# Patient Record
Sex: Female | Born: 1970 | Race: White | Hispanic: No | Marital: Married | Smoking: Never smoker
Health system: Southern US, Community
[De-identification: ages and names within clinical notes are randomized; demographics above are authoritative.]

---

## 1997-11-09 HISTORY — PX: REFRACTIVE SURGERY: SHX103

## 2005-05-05 ENCOUNTER — Ambulatory Visit: Payer: Self-pay | Admitting: Obstetrics and Gynecology

## 2009-05-03 ENCOUNTER — Ambulatory Visit: Payer: Self-pay | Admitting: Obstetrics and Gynecology

## 2010-06-04 ENCOUNTER — Ambulatory Visit: Payer: Self-pay | Admitting: Obstetrics and Gynecology

## 2011-05-27 ENCOUNTER — Encounter: Payer: Self-pay | Admitting: Sports Medicine

## 2011-05-27 ENCOUNTER — Ambulatory Visit (INDEPENDENT_AMBULATORY_CARE_PROVIDER_SITE_OTHER): Payer: BC Managed Care – PPO | Admitting: Sports Medicine

## 2011-05-27 VITALS — BP 132/92 | HR 80 | Ht 66.0 in | Wt 135.0 lb

## 2011-05-27 DIAGNOSIS — M25569 Pain in unspecified knee: Secondary | ICD-10-CM

## 2011-05-27 DIAGNOSIS — M25561 Pain in right knee: Secondary | ICD-10-CM | POA: Insufficient documentation

## 2011-05-27 NOTE — Progress Notes (Signed)
  Subjective:    Patient ID: Tiffany Mayer, female    DOB: 11-05-1971, 40 y.o.   MRN: [id number]  HPI  New pt that presents to clinic for evaluation of rt anteriolateral knee pain.   Review of Systems     Objective:   Physical Exam        Assessment & Plan:

## 2011-05-27 NOTE — Assessment & Plan Note (Addendum)
I suspect that she has a combination of iliotibial band syndrome and hip abductor weakness. Her new shoes are also likely contributory and these need to be broken in. We recommended a set of rehabilitation exercises to strengthen her hip abductors, including hip rotation, step ups, lying hip abductor raises. We will also mail her a knee sleeve to reduce swelling as we did not have the correct size in stock today. I would like to see her back in 6 weeks.  Also given sports insoles for cushion and her gait improves with this

## 2011-05-27 NOTE — Progress Notes (Signed)
  Subjective:    Patient ID: Tiffany Mayer, female    DOB: 07/07/1971, 40 y.o.   MRN: [id number] PCP: Lorie Phenix HPI Tiffany Mayer comes to see [place] for right lateral knee pain for approximately 1.5 months. She runs approximately 25-30 miles per week, teaches aerobics, does Pilates, and does weight training, elliptical, and bike. She notes that approximately 1.5 months ago when she bought new running shoes, she began to develop pain in the anterolateral knee. There was no known injury. Pain is worse with running, increased walking, and going down stairs. Since then she has begin running on flat or roads, and runs approximately 1 day less than usual. After these changes she noted that her pain is starting to decrease. She notices no popping catching or locking or giving way. There is also minimal to no swelling of the knee. No fevers chills weight loss or night sweats.  Past medical history: None Social history: Denies alcohol tobacco or drugs, works as a Runner, broadcasting/film/video in [employer]. Family history: Heart disease and high blood pressure and her father. Review of Systems    negative except as noted above in the history of present illness. Objective:   Physical Exam    general: Well-developed well-nourished Caucasian female. Musculoskeletal exam: The patient's knees are overall unremarkable to inspection, she does have bilateral prominent lateral tibial plateaus. Her range of motion is full from 0-135. Strength is 5 out of 5 to flexion and extension. Anterior cruciate ligament, PCL, MCL, LCL are all intact. McMurray's test is negative. Hip abductor strength is very weak bilaterally. She has no tenderness to palpation over the pes anserine insertion.  We analyzed her gait in both her old and new running shoes: I did note a near perfect running form in her old shoes, and some right knee wobble when running in her new shoes.  Musculoskeletal ultrasound:  We imaged her patellar tendon and  long and transverse views, menisci, and iliotibial band. We did note a small amount of fluid in the retro-patellar bursa, she also had a small amount of fluid just deep to her iliotibial band. Menisci are unremarkable and patellar tendon was unremarkable. Images saved.  Assessment & Plan:

## 2011-06-09 ENCOUNTER — Ambulatory Visit: Payer: Self-pay | Admitting: Obstetrics and Gynecology

## 2011-07-15 ENCOUNTER — Ambulatory Visit (INDEPENDENT_AMBULATORY_CARE_PROVIDER_SITE_OTHER): Payer: BC Managed Care – PPO | Admitting: Sports Medicine

## 2011-07-15 VITALS — BP 130/97

## 2011-07-15 DIAGNOSIS — M25561 Pain in right knee: Secondary | ICD-10-CM

## 2011-07-15 DIAGNOSIS — M25569 Pain in unspecified knee: Secondary | ICD-10-CM

## 2011-07-15 NOTE — Assessment & Plan Note (Signed)
Improved, likely from iliotibial band Continue use of compression sleeve x6 weeks Continue hip strengthening exercises If continuing to improve can return as needed, if not improved return after 6 weeks

## 2011-07-15 NOTE — Progress Notes (Signed)
  Subjective:    Patient ID: Tiffany Mayer, female    DOB: [birth year], 40 y.o.   MRN: [id number]  HPI Here for follow up of R knee pain.  Pain improved by 75% since last visit.  Still using compression sleeve on the knee, feels like that has helped.  Doing hip strengthening exercises daily.  Pain still occasionally when she first starts running or just stops running.  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    Review of Systems     Objective:   Physical Exam R Knee Normal to inspection and palpation Patellar compression negative MCL, LCL, ACL, PCL with good endpoints McMurray negative Strength 5/5 with flexion/extension, with FROM Hip abduction with 5/5 strength, no pain. Hip adduction with 5/5 strength, no pain.  Gait: R shoulder slightly lower than L Otherwise gait normal No leg length discrepancy.         Assessment & Plan:

## 2012-07-07 ENCOUNTER — Ambulatory Visit: Payer: Self-pay | Admitting: Obstetrics and Gynecology

## 2013-03-23 ENCOUNTER — Ambulatory Visit (INDEPENDENT_AMBULATORY_CARE_PROVIDER_SITE_OTHER): Payer: BC Managed Care – PPO | Admitting: Sports Medicine

## 2013-03-23 VITALS — BP 110/70 | Ht 66.0 in | Wt 135.0 lb

## 2013-03-23 DIAGNOSIS — M25579 Pain in unspecified ankle and joints of unspecified foot: Secondary | ICD-10-CM

## 2013-03-23 DIAGNOSIS — M25571 Pain in right ankle and joints of right foot: Secondary | ICD-10-CM

## 2013-03-24 NOTE — Progress Notes (Signed)
  Subjective:    Patient ID: Tiffany Mayer, female    DOB: 1971/08/05, 42 y.o.   MRN: [id number]  HPI chief complaint: Right foot pain  Very pleasant 42 year old female comes in today complaining of 2 months of dorsal right foot pain. She had 2 injuries leading up to her foot pain although she's not sure whether or not they contribute to her current symptoms. She stepped in a manhole suffering a dorsiflexion injury to her forefoot. This was followed by an inversion injury to her right ankle. However, the pain in her foot began sometime later. She describes an aching discomfort diffusely across the dorsum of her forefoot with running. In fact, she states the pain is most noticeable with walking and improves a little bit with running. She's noticed intermittent swelling. She denies pain on the plantar aspect of her foot. She denies problems with her foot in the past but she has been seen in our clinic previously for some knee issues and was given some green sports insoles which she recently started to use again. Right before her foot pain she was dealing with some left hamstring issues and wonders whether or not her pain is compensatory. In addition to running she enjoys kickboxing, aerobics, and Pilates.  Past medical history and current medications are reviewed    Review of Systems     Objective:   Physical Exam Well-developed, well-nourished. No acute distress. Vital signs are reviewed  Examination of the right foot in the standing position shows collapse of the transverse arch. She is tender to palpation along the distal second metatarsal. No soft tissue swelling. Mild pain with metatarsal squeeze but not marked. No tenderness to palpation elsewhere throughout the foot. Extensor tendons are intact. Examination of the right ankle shows full range of motion without an effusion. Good stability. Negative anterior drawer, negative talar tilt. Patient is able to walk without a limp  MSK ultrasound  of the right foot: Images were obtained both in long and short view. There appears to be some hyperechoic changes along the dorsum of the distal second metatarsal consistent with a probable healing stress fracture. I do not see any cortical defect but there is some edema in this area as well as neovascularization.       Assessment & Plan:  1. Right foot pain likely secondary to healing second metatarsal stress fracture  Metatarsal pads for her green sports insoles. Cross train for the next couple of weeks first on a bike and then transitioning to an elliptical. She can then start some light running as tolerated and will followup with me in 3 weeks. I've also asked her to refrain from impact activities such as aerobics and kickboxing.

## 2013-03-29 ENCOUNTER — Ambulatory Visit: Payer: BC Managed Care – PPO | Admitting: Sports Medicine

## 2013-04-13 ENCOUNTER — Ambulatory Visit
Admission: RE | Admit: 2013-04-13 | Discharge: 2013-04-13 | Disposition: A | Discharge status: Home / Self Care | Payer: BC Managed Care – PPO | Source: Ambulatory Visit | Attending: Sports Medicine | Admitting: Sports Medicine

## 2013-04-13 ENCOUNTER — Ambulatory Visit (INDEPENDENT_AMBULATORY_CARE_PROVIDER_SITE_OTHER): Payer: BC Managed Care – PPO | Admitting: Sports Medicine

## 2013-04-13 VITALS — BP 125/80 | Ht 66.0 in | Wt 135.0 lb

## 2013-04-13 DIAGNOSIS — M79671 Pain in right foot: Secondary | ICD-10-CM

## 2013-04-13 DIAGNOSIS — M79609 Pain in unspecified limb: Secondary | ICD-10-CM

## 2013-04-13 NOTE — Progress Notes (Signed)
  Subjective:    Patient ID: Tiffany Mayer, female    DOB: 06-06-1971, 42 y.o.   MRN: [id number]  HPI Patient comes in today for followup on right foot pain. Ultrasound at her last visit suggested a healing second metatarsal stress fracture. It's been 3 weeks since that visit. She did no running for 2 weeks and noticed 65% improvement. She then went for a 5 mile run 4 days ago and began to experience returning pain afterwards. Her pain is primarily across the dorsum of her foot from the distal second metatarsal to the fourth metatarsal but she is also getting some radiating discomfort into the anterior ankle as well as in her right great toe. Her pain is most noticeable with toe off. She has been using her metatarsal pads on her green sports insoles. She did notice some mild swelling across the distal second and third metatarsals after her run. No real pain at rest.    Review of Systems     Objective:   Physical Exam Well-developed, well-nourished. No acute distress  Right foot: Full range of motion at the ankle. No soft tissue swelling. She has no significant tenderness to palpation along the distal second, third, or fourth metatarsals. There is some mild pain with metatarsal squeeze however. There is also some pain with passive flexion of the first MTP joint. Mild bunion deformity and mild hallux rigidus. Neurovascularly intact distally and walking without significant limp.  MSK ultrasound of the right foot: Images were obtained and compared to previous images. There appears to be solid callus formation across the dorsum of the distal second metatarsal.       Assessment & Plan:  1. Persistent right foot pain likely secondary to healing second metatarsal stress fracture 2. Right toe bunion deformity  I'm going to get a plain x-ray of her right foot. After reviewing that x-ray I will give her a call. If unremarkable, I will have her return to the office sometime next week with her orthotics.  We will need to do a running gait analysis and adjust her orthotics. She may need some posting of the first ray for her hallux rigidus. In the meantime, no running.

## 2013-04-20 ENCOUNTER — Encounter: Payer: Self-pay | Admitting: Sports Medicine

## 2013-04-20 ENCOUNTER — Ambulatory Visit (INDEPENDENT_AMBULATORY_CARE_PROVIDER_SITE_OTHER): Payer: BC Managed Care – PPO | Admitting: Sports Medicine

## 2013-04-20 VITALS — BP 120/87 | HR 73 | Ht 63.0 in | Wt 135.0 lb

## 2013-04-20 DIAGNOSIS — M2020 Hallux rigidus, unspecified foot: Secondary | ICD-10-CM

## 2013-04-20 DIAGNOSIS — M2021 Hallux rigidus, right foot: Secondary | ICD-10-CM

## 2013-04-20 DIAGNOSIS — M216X9 Other acquired deformities of unspecified foot: Secondary | ICD-10-CM

## 2013-04-20 NOTE — Progress Notes (Signed)
  Subjective:    Patient ID: Tiffany Mayer, female    DOB: [birth year], 42 y.o.   MRN: [id number]  HPI Patient comes in today for followup. X-rays of her right foot were basically unremarkable other than some mild degenerative changes at the first MTP and a mild bunion deformity. She continues to experience intermittent discomfort across the dorsum of her foot that she localizes to the distal second third and fourth metatarsals. Her pain is most noticeable first thing in the morning. She has been able to run and notes that her pain is rather tolerable with activity. Previous ultrasound suggested a healed second metatarsal stress fracture but plain x-rays do not show any evidence of bony abnormality. I asked her to return today with her inserts. He previously gave her some green sports insoles with metatarsal pads. She feels like the metatarsal pad may be applying a little too much pressure to the medial forefoot but otherwise they are fairly comfortable.    Review of Systems     Objective:   Physical Exam Right foot: Mild tenderness to palpation along the distal second third and fourth metatarsals. Mild hallux rigidus of the first MTP joint. No soft tissue swelling. Fairly well-preserved longitudinal arches but a collapsed transverse arch bilaterally. Evaluation of her running form shows a fairly neutral pattern with a good heel to toe gait. She runs without a limp.       Assessment & Plan:  1. Right foot pain with transverse arch collapse and mild hallux rigidus  I adjusted the metatarsal pads to a more comfortable position and I added a first ray post for her hallux rigidus. I reassured her that her x-ray show no significant bony abnormality. At this point in time I think she can continue running but I've recommended that she runs every other day until followup with me again in 4 weeks. I've also recommended that she get some new running shoes (the once she has now have about 300 miles on them)  and try applying Aspercreme to her foot at night. If her symptoms persist or worsen I would consider merits of further diagnostic imaging in the form of an MRI scan. She will let me know if symptoms worsen prior to her followup visit.

## 2013-05-18 ENCOUNTER — Ambulatory Visit: Payer: BC Managed Care – PPO | Admitting: Sports Medicine

## 2013-06-01 ENCOUNTER — Encounter: Payer: Self-pay | Admitting: Sports Medicine

## 2013-06-01 ENCOUNTER — Ambulatory Visit (INDEPENDENT_AMBULATORY_CARE_PROVIDER_SITE_OTHER): Payer: BC Managed Care – PPO | Admitting: Sports Medicine

## 2013-06-01 VITALS — BP 117/80 | HR 69 | Ht 63.0 in | Wt 135.0 lb

## 2013-06-01 DIAGNOSIS — M25579 Pain in unspecified ankle and joints of unspecified foot: Secondary | ICD-10-CM | POA: Insufficient documentation

## 2013-06-01 DIAGNOSIS — M25571 Pain in right ankle and joints of right foot: Secondary | ICD-10-CM

## 2013-06-02 NOTE — Progress Notes (Signed)
  Subjective:    Patient ID: Tiffany Mayer, female    DOB: 10-Dec-1970, 42 y.o.   MRN: [id number]  HPI Patient comes in today for followup on right foot pain. She states that she is about "70% better". Her pain has actually moved and is now more along the dorsum of the ankle. Some mild swelling here as well. Her pain here improved and she wears a shoe with an elevated heel. She has been able to resume running without any real problem. She did discontinue the metatarsal pad as she felt like it was causing some cramping in her lower leg.    Review of Systems     Objective:   Physical Exam Well-developed, well-nourished. No acute distress. Awake alert and oriented x3  Right foot: No soft tissue swelling. No pain with metatarsal squeeze. Neurovascularly intact distally. Walking without a limp.       Assessment & Plan:  1. Improved right foot pain  I reviewed her x-rays again and I do not see any significant midfoot degenerative changes. I would like to try a small 5/16 inch heel lift for each issue to see if that'll alleviate the discomfort that she is getting across the dorsum of her ankle with activity. The original pain in the distal forefoot has resolved and she has been able to resume running without any real difficulty. At this point I think she is safe to increase activity as tolerated she will followup PRN.

## 2013-07-12 ENCOUNTER — Ambulatory Visit: Payer: Self-pay | Admitting: Obstetrics and Gynecology

## 2014-11-12 ENCOUNTER — Ambulatory Visit: Payer: Self-pay | Admitting: Obstetrics and Gynecology

## 2015-04-19 ENCOUNTER — Encounter: Payer: Self-pay | Admitting: Family Medicine

## 2015-04-19 ENCOUNTER — Ambulatory Visit (INDEPENDENT_AMBULATORY_CARE_PROVIDER_SITE_OTHER): Payer: BC Managed Care – PPO | Admitting: Family Medicine

## 2015-04-19 VITALS — BP 106/64 | HR 68 | Temp 98.2°F | Resp 16 | Ht 66.0 in | Wt 133.0 lb

## 2015-04-19 DIAGNOSIS — L03032 Cellulitis of left toe: Secondary | ICD-10-CM | POA: Diagnosis not present

## 2015-04-19 DIAGNOSIS — M26609 Unspecified temporomandibular joint disorder, unspecified side: Secondary | ICD-10-CM | POA: Insufficient documentation

## 2015-04-19 DIAGNOSIS — G479 Sleep disorder, unspecified: Secondary | ICD-10-CM | POA: Insufficient documentation

## 2015-04-19 DIAGNOSIS — R635 Abnormal weight gain: Secondary | ICD-10-CM | POA: Insufficient documentation

## 2015-04-19 MED ORDER — DOXYCYCLINE HYCLATE 100 MG PO TABS: 100.0000 mg | ORAL_TABLET | Freq: Two times a day (BID) | ORAL | Status: DC

## 2015-04-19 NOTE — Progress Notes (Signed)
   Subjective:    Patient ID: Tiffany Mayer, female    DOB: 04-Nov-1971, 44 y.o.   MRN: [id number]  HPI Comments: Pt is c/o left great toe redness, swelling, and bleeding. Pt also noticed a "bump" on her toe that appeared 1 week ago. Pt sates she went to the beach, and the bump appeared after the beach trip. Pt states the bump is occasionally "itchy". Pt states she is a runner, and is unsure if that could have caused the problem.  Redness is around nailbed that has bruising and lifted off skin at the base.   Toe Pain  The incident occurred more than 1 week ago (2-3 months). There was no injury mechanism. The pain is present in the left toes. Quality: sharp around nail edges. The pain is at a severity of 2/10. The pain is mild. The pain has been fluctuating since onset. Pertinent negatives include no inability to bear weight, loss of motion, loss of sensation, muscle weakness, numbness or tingling. She reports no foreign bodies present. The symptoms are aggravated by palpation. Treatments tried: Neosporin Cream. The treatment provided mild relief.    Here because thinks may be infected.      Review of Systems  Constitutional: Negative for fever, chills, diaphoresis, activity change, appetite change, fatigue and unexpected weight change.  Musculoskeletal: Positive for joint swelling.       See HPI  Skin: Positive for color change.  Neurological: Negative for tingling and numbness.   BP 106/64 mmHg  Pulse 68  Temp(Src) 98.2 F (36.8 C) (Oral)  Resp 16  Ht 5\' 6"  (1.676 m)  Wt 133 lb (60.328 kg)  BMI 21.48 kg/m2  LMP 04/16/2015 (Exact Date)   No past medical history on file. Past Surgical History  Procedure Laterality Date  . Refractive surgery Bilateral 1999    reports that she has never smoked. She has never used smokeless tobacco. She reports that she drinks alcohol. She reports that she does not use illicit drugs. family history includes Cirrhosis in her mother; Heart attack in  her father; Osteoporosis in her mother; Thyroid disease in her mother. Allergies  Allergen Reactions  . Codeine Other (See Comments)    Elevated bp and pulse, flushing, and anxiety        Objective:   Physical Exam  Constitutional: She appears well-developed and well-nourished.  Musculoskeletal:       Feet:          Assessment & Plan:   1. Paronychia, toe, left  Will treat with Doxycycline. Please call back if condition worsens or does not continue to improve and will refer to podiatry.

## 2015-10-16 ENCOUNTER — Other Ambulatory Visit: Payer: Self-pay | Admitting: Obstetrics and Gynecology

## 2015-10-16 DIAGNOSIS — Z1231 Encounter for screening mammogram for malignant neoplasm of breast: Secondary | ICD-10-CM

## 2015-11-14 ENCOUNTER — Ambulatory Visit
Admission: RE | Admit: 2015-11-14 | Discharge: 2015-11-14 | Disposition: A | Discharge status: Home / Self Care | Payer: BC Managed Care – PPO | Source: Ambulatory Visit | Attending: Obstetrics and Gynecology | Admitting: Obstetrics and Gynecology

## 2015-11-14 DIAGNOSIS — Z1231 Encounter for screening mammogram for malignant neoplasm of breast: Secondary | ICD-10-CM | POA: Insufficient documentation

## 2016-10-22 ENCOUNTER — Other Ambulatory Visit: Payer: Self-pay | Admitting: Obstetrics and Gynecology

## 2016-10-22 DIAGNOSIS — Z1231 Encounter for screening mammogram for malignant neoplasm of breast: Secondary | ICD-10-CM

## 2016-12-01 ENCOUNTER — Ambulatory Visit
Admission: RE | Admit: 2016-12-01 | Discharge: 2016-12-01 | Disposition: A | Discharge status: Home / Self Care | Payer: BC Managed Care – PPO | Source: Ambulatory Visit | Attending: Obstetrics and Gynecology | Admitting: Obstetrics and Gynecology

## 2016-12-01 DIAGNOSIS — Z1231 Encounter for screening mammogram for malignant neoplasm of breast: Secondary | ICD-10-CM | POA: Diagnosis present

## 2017-10-26 ENCOUNTER — Other Ambulatory Visit: Payer: Self-pay | Admitting: Obstetrics and Gynecology

## 2017-10-26 DIAGNOSIS — Z1231 Encounter for screening mammogram for malignant neoplasm of breast: Secondary | ICD-10-CM

## 2017-12-14 ENCOUNTER — Ambulatory Visit
Admission: RE | Admit: 2017-12-14 | Discharge: 2017-12-14 | Disposition: A | Discharge status: Home / Self Care | Payer: BC Managed Care – PPO | Source: Ambulatory Visit | Attending: Obstetrics and Gynecology | Admitting: Obstetrics and Gynecology

## 2017-12-14 DIAGNOSIS — Z1231 Encounter for screening mammogram for malignant neoplasm of breast: Secondary | ICD-10-CM | POA: Diagnosis not present

## 2018-06-01 ENCOUNTER — Ambulatory Visit: Payer: BC Managed Care – PPO | Admitting: Sports Medicine

## 2018-06-01 VITALS — BP 126/70 | Ht 66.0 in | Wt 135.0 lb

## 2018-06-01 DIAGNOSIS — S86111A Strain of other muscle(s) and tendon(s) of posterior muscle group at lower leg level, right leg, initial encounter: Secondary | ICD-10-CM

## 2018-06-01 DIAGNOSIS — S76311A Strain of muscle, fascia and tendon of the posterior muscle group at thigh level, right thigh, initial encounter: Secondary | ICD-10-CM

## 2018-06-01 NOTE — Patient Instructions (Signed)
Hamstring strain, irregularity visualized on ultrasound. Serial monitoring q4weeks. Eccentric hamstring exercises. No strenuous running. Recommend cross training, NSAIDs.  Gastroc strain. Eccentric gastroc exercises. Serial ultrasound monitoring q4weeks.

## 2018-06-01 NOTE — Progress Notes (Signed)
Tiffany Mayer - 47 y.o. female MRN [id number]  Date of birth: 06/13/1971   Chief complaint: Right calf pain  SUBJECTIVE:    History of present illness: Patient is a 47 year old Caucasian female who presents to the sports clinic with a chief complaint of right calf injury.  She states that she is an avid runner approximately 30 miles per week when she was running a 4 mile stretch on May 02, 2018.  1 mile into her run she noticed a tightness in her right medial calf muscle.  She stopped running and proceeded to stretch this gastrocnemius muscle.  She then tried to finish her run where she heard a pop and had a sudden pain in her posterior knee both medially and laterally.  At this point, she did have to limp back and was unable to finish the run.  She tried stopping running and crosstraining doing both walking and plyometrics for the next 2 to 3 weeks.  Her symptoms persisted however.  Today, she states that her pain is approximately a 1 out of 10 however she has been unable to run.  She localizes it to her medial gastrocnemius muscle as well as posteriorly behind her knee.  She states it is dull in nature unless she starts running and then it is sharp in nature and stops her from running.  The pain however is nonradiating.  It is not associated with numbness or tingling.  Walking and plyometrics do not exacerbate her symptoms.  Rest also improves her symptoms.  She has never had anything like this before.  She has never had surgery on her knee.  She denies any anterior knee pain or hip pain.  She has been seen approximately 3 years ago in the clinic for IT band syndrome as well as foot pain.   Review of systems:  As stated above   Interval past medical history, surgical history, family history, and social history obtained and are unchanged.   OBJECTIVE:  Physical exam: Vital signs are reviewed. BP 126/70   Ht 5\' 6"  (1.676 m)   Wt 135 lb (61.2 kg)   BMI 21.79 kg/m   Gen.: Alert,  oriented, appears stated age, in no apparent distress Gait: normal without associated limp Musculoskeletal: No obvious abnormality upon inspection of the right knee and posterior gastrocnemius muscles.  No ecchymoses.  She has tenderness to palpation in the medial gastrocnemius muscle as well as at the distal insertion of the medial hamstring.  She has mild tenderness in the posterior lateral knee as well.  Patient does have full range of motion and knee flexion and extension as well as hip flexion and extension.  Strength testing is 5 out of 5 in hamstring, 5 out of 5 in quadriceps.  Special tests: Negative Lachman, negative anterior and posterior drawers, negative varus valgus stress.  Negative McMurray test.  Negative hook test of the Achilles.  Positive hamstring tightness with stretch.  She is neurovascularly intact.  Diagnostics: A bedside ultrasound was performed over her right lower leg.  There is a small defect in the medial gastrocnemius muscle mid body with increased signal and neovascularization.  The lateral gastrocnemius muscle appeared intact without irregularities.  There was no obvious knee effusion.  There was also a larger approximately 50% defect in the medial hamstring muscles.  These pictures were saved and uploaded for serial monitoring.  Achilles tendon is intact.   ASSESSMENT & PLAN: 1.  Medial hamstring muscle partial tear 2.  Medial gastrocnemius muscle  strain   Plan: Given the patient's history, physical exam, and radiographic findings, I do believe she is suffering from an acute injury to both her medial hamstring i.e. semimembranosus and semitendinosis as well as medial gastrocnemius muscle.  I am recommending relative rest from running until improvement in her pain and overall functioning.  I am also recommending eccentric exercises for which Aundra MilletMegan was able to review these exercises with her.  These ultimately along with time will help heal her injury.  If for some reason  she is not improving, I did discuss the need for further work-up and/or imaging including an MRI.  She may wear a compression sleeve both on her knee and on her gastrocnemius muscles for a layer of support.  She may also take anti-inflammatories as needed for any pain or discomfort.  I am recommending crosscheck training in the meantime while she is healing.  The patient is agreeable to all of these.  I recommend follow-up in 3 to 4 weeks with repeat ultrasound imaging of both her gastrocnemius and her medial hamstring muscles.  She was discharged in stable condition.  Gustavus MessingAJ Leeandre Nordling, DO Sports Medicine Fellow [facility]

## 2018-06-02 ENCOUNTER — Ambulatory Visit: Payer: BC Managed Care – PPO | Admitting: Sports Medicine

## 2018-06-22 ENCOUNTER — Encounter: Payer: Self-pay | Admitting: Sports Medicine

## 2018-06-22 ENCOUNTER — Ambulatory Visit: Payer: BC Managed Care – PPO | Admitting: Sports Medicine

## 2018-06-22 VITALS — BP 100/60 | Ht 66.0 in | Wt 135.0 lb

## 2018-06-22 DIAGNOSIS — S86111D Strain of other muscle(s) and tendon(s) of posterior muscle group at lower leg level, right leg, subsequent encounter: Secondary | ICD-10-CM

## 2018-06-22 DIAGNOSIS — S76311D Strain of muscle, fascia and tendon of the posterior muscle group at thigh level, right thigh, subsequent encounter: Secondary | ICD-10-CM

## 2018-06-22 NOTE — Patient Instructions (Signed)
It was great to see you today for follow up of your hamstring and calf strain.  You are clear to START running again!  I am recommending starting off with 1-3 miles every OTHER day to start. If you feel tightening in your hamstring or calf muscles, STOP and start back up the next day. Continue to do your calf and hamstring strengthening/stretching exercises. We added in the broom stick leaning forward stretch as well as single leg squats forward, to the side, and backward 3 sets of 10. If you are not having any problems, follow up will be as needed. Otherwise, if you have a setback, then we will consider a repeat US of the junction zone of the calf muscles.  Good luck!  Dr. Laureen OchsPinney

## 2018-06-22 NOTE — Progress Notes (Signed)
Henri Iran PlanasMelaine Boomhower - 47 y.o. female MRN [id number]  Date of birth: 11/03/1971   Chief complaint: Right hamstring and gastrocnemius muscle strain  SUBJECTIVE:    History of present illness: Patient is a 47 year old female who presents today for follow-up and reevaluation of her right hamstring and gastrocnemius muscle strains.  She is an avid 30 mile per week runner and she has been off of running for 33 days.  Her treatment to date has been relative rest, anti-inflammatories, ice, and activity modification.  She walked approximately 30 miles last week however she has not attempted a trial at a run.  She is wearing a calf sleeve and knee brace which does seem to help her with her symptoms.  She is currently pain-free today.  She also does cross train with recumbent bike and light weights.  She expresses desire to start running again.  She denies any new symptoms of hip pain or low back pain.  No numbness or tingling.  No right-sided anterior knee pain.   Review of systems:  As stated above   Interval past medical history, surgical history, family history, and social history obtained and are unchanged.   She is a non-smoker.  OBJECTIVE:  Physical exam: Vital signs are reviewed. BP 100/60   Ht 5\' 6"  (1.676 m)   Wt 135 lb (61.2 kg)   BMI 21.79 kg/m   Gen.: Alert, oriented, appears stated age, in no apparent distress Gait: normal without associated limp, she was examined with a light jog and had an adequate center of gravity without reproduction of her previous hamstring or gastrocnemius muscle pain Back: No cervical, thoracic, or lumbar spine tenderness, no myofascial tenderness laterally Musculoskeletal: Inspection of the right lower extremity demonstrates no acute abnormality.  No tenderness to palpation at the knee or palpable effusion.  She has full range of motion knee flexion and extension.  Muscle strength testing is 5 out of 5 in knee flexion and extension.  5 out of 5 in hamstring  muscles with hip extension.  5 out of 5 in gastrocnemius and soleus muscles.  No reproduction of pain was found with resisted motion.  Negative anterior posterior drawer.  Negative Lockman's test.  The knee is stable to varus valgus stress.  She is neurovascularly intact.    ASSESSMENT & PLAN: 1. Gastrocnemius muscle strain, right, subsequent - improving 2. Hamstring muscle strain, right, subsequent - improving   Plan: After reexamining the patient, I do believe she is improving at the expected course of a muscle strain/injury.  I do believe given that she is pain-free when I examined her dog in the office as well as do a single-leg squat on the right lower extremity, she may start back with running.  Because she is a 30 mile per week runner, I am recommending that she start off at 1 to 3 miles every other day.  If at any point, she feels a tightening in the mid substance of her gastrocnemius muscle or at the junction, she was advised to stop running and rest and start back up the following day.  She may continue to wear her calf compression sleeve and her knee brace if it feels comfortable.  Her follow-up will be tentatively as needed unless she has any setbacks with her return to running.  She is to continue doing her eccentric calf and hamstring muscle exercises minimum daily.  She may call with questions.  After visit summary was given to her prior to discharge.  Forde RadonAJ  Laureen OchsPinney, DO Sports Medicine Fellow [facility]

## 2018-11-28 ENCOUNTER — Other Ambulatory Visit: Payer: Self-pay | Admitting: Obstetrics and Gynecology

## 2018-11-28 DIAGNOSIS — Z1231 Encounter for screening mammogram for malignant neoplasm of breast: Secondary | ICD-10-CM

## 2018-12-20 ENCOUNTER — Ambulatory Visit
Admission: RE | Admit: 2018-12-20 | Discharge: 2018-12-20 | Disposition: A | Discharge status: Home / Self Care | Payer: BC Managed Care – PPO | Source: Ambulatory Visit | Attending: Obstetrics and Gynecology | Admitting: Obstetrics and Gynecology

## 2018-12-20 DIAGNOSIS — Z1231 Encounter for screening mammogram for malignant neoplasm of breast: Secondary | ICD-10-CM | POA: Insufficient documentation

## 2019-12-05 ENCOUNTER — Other Ambulatory Visit: Payer: Self-pay | Admitting: Obstetrics and Gynecology

## 2019-12-05 DIAGNOSIS — Z1231 Encounter for screening mammogram for malignant neoplasm of breast: Secondary | ICD-10-CM

## 2020-01-03 ENCOUNTER — Ambulatory Visit
Admission: RE | Admit: 2020-01-03 | Discharge: 2020-01-03 | Disposition: A | Discharge status: Home / Self Care | Payer: BC Managed Care – PPO | Source: Ambulatory Visit | Attending: Obstetrics and Gynecology | Admitting: Obstetrics and Gynecology

## 2020-01-03 DIAGNOSIS — Z1231 Encounter for screening mammogram for malignant neoplasm of breast: Secondary | ICD-10-CM

## 2020-12-10 ENCOUNTER — Other Ambulatory Visit: Payer: Self-pay | Admitting: Obstetrics and Gynecology

## 2020-12-10 DIAGNOSIS — Z1231 Encounter for screening mammogram for malignant neoplasm of breast: Secondary | ICD-10-CM

## 2020-12-10 DIAGNOSIS — N6002 Solitary cyst of left breast: Secondary | ICD-10-CM

## 2020-12-19 ENCOUNTER — Ambulatory Visit
Admission: RE | Admit: 2020-12-19 | Discharge: 2020-12-19 | Disposition: A | Discharge status: Home / Self Care | Payer: BC Managed Care – PPO | Source: Ambulatory Visit | Attending: Obstetrics and Gynecology | Admitting: Obstetrics and Gynecology

## 2020-12-19 ENCOUNTER — Other Ambulatory Visit: Payer: Self-pay

## 2020-12-19 DIAGNOSIS — N6002 Solitary cyst of left breast: Secondary | ICD-10-CM

## 2021-12-18 ENCOUNTER — Other Ambulatory Visit: Payer: Self-pay | Admitting: Obstetrics and Gynecology

## 2021-12-18 DIAGNOSIS — Z1231 Encounter for screening mammogram for malignant neoplasm of breast: Secondary | ICD-10-CM

## 2022-01-27 ENCOUNTER — Other Ambulatory Visit: Payer: Self-pay

## 2022-01-27 ENCOUNTER — Ambulatory Visit
Admission: RE | Admit: 2022-01-27 | Discharge: 2022-01-27 | Disposition: A | Discharge status: Home / Self Care | Payer: BC Managed Care – PPO | Source: Ambulatory Visit | Attending: Obstetrics and Gynecology | Admitting: Obstetrics and Gynecology

## 2022-01-27 DIAGNOSIS — Z1231 Encounter for screening mammogram for malignant neoplasm of breast: Secondary | ICD-10-CM | POA: Insufficient documentation

## 2022-07-22 ENCOUNTER — Ambulatory Visit (INDEPENDENT_AMBULATORY_CARE_PROVIDER_SITE_OTHER): Payer: Self-pay | Admitting: Nurse Practitioner

## 2022-07-22 ENCOUNTER — Encounter: Payer: Self-pay | Admitting: Nurse Practitioner

## 2022-07-22 VITALS — BP 112/70 | HR 80 | Temp 98.2°F | Ht 66.0 in | Wt 133.4 lb

## 2022-07-22 DIAGNOSIS — J0100 Acute maxillary sinusitis, unspecified: Secondary | ICD-10-CM

## 2022-07-22 MED ORDER — AZITHROMYCIN 250 MG PO TABS: ORAL_TABLET | ORAL | 0 refills | Status: AC

## 2022-07-22 MED ORDER — FLUTICASONE PROPIONATE 50 MCG/ACT NA SUSP: 2.0000 | Freq: Every day | NASAL | 6 refills | Status: AC

## 2022-07-22 NOTE — Progress Notes (Signed)
[facility]rap[facility] [address] [place], [place] [address] [phone]  Office Visit Note  Patient Name: Tiffany Mayer Date of Birth [id number]  Medical Record number [id number]  Date of Service: 07/22/2022  Chief Complaint  Patient presents with   Sinus Problem    Cold at the end of July. Last week had swollen glands, Ear pressure, headache, mucus [place] yellow/bloody and  thick. Post nasal drainage. COVID was neg. Has taken Advill     HPI 51 year old female presenting to [facility] with complaints of congestion for the past 3 weeks. Started with body aches, fever/chills and congestion with sore throat. She took a COVID test at the start of illness and was negative.   She has had COVID in the past one year ago  Since onset of symptoms she has had improvement and then worsening of sinus pressure, ear fullness and sore throat.   Denies fever in past few days She has been using Advil  Has run out of Flonase   Current Medication:  Outpatient Encounter Medications as of 07/22/2022  Medication Sig   fluticasone (FLONASE) 50 MCG/ACT nasal spray Place into the nose.   Levonorg-Eth Estrad Triphasic (TRIPHASIL, 28, PO) Take 1 tablet by mouth daily.     Multiple Vitamin (MULTIVITAMIN) capsule Take 1 capsule by mouth daily.   doxycycline (VIBRA-TABS) 100 MG tablet Take 1 tablet (100 mg total) by mouth 2 (two) times daily. (Patient not taking: Reported on 07/22/2022)   No facility-administered encounter medications on file as of 07/22/2022.     Vital Signs: BP 112/70 (BP Location: Left Arm, Patient Position: Sitting, Cuff Size: Normal)   Pulse 80   Temp 98.2 F (36.8 C) (Tympanic)   Ht 5\' 6"  (1.676 m)   Wt 133 lb 6.4 oz (60.5 kg)   SpO2 98%   BMI 21.53 kg/m    Review of Systems  Constitutional: Negative.   HENT:  Positive for congestion, sinus pressure and sore throat.   Eyes: Negative.   Respiratory: Negative.    Cardiovascular: Negative.    Genitourinary: Negative.   Musculoskeletal: Negative.   Neurological:  Positive for headaches.    Physical Exam HENT:     Head: Normocephalic.     Right Ear: Tympanic membrane, ear canal and external ear normal.     Left Ear: Tympanic membrane, ear canal and external ear normal.     Nose: Congestion present.  Eyes:     Pupils: Pupils are equal, round, and reactive to light.  Cardiovascular:     Rate and Rhythm: Normal rate and regular rhythm.     Heart sounds: Normal heart sounds.  Pulmonary:     Effort: Pulmonary effort is normal.     Breath sounds: Normal breath sounds.  Musculoskeletal:     Cervical back: Normal range of motion.  Skin:    General: Skin is warm.  Neurological:     General: No focal deficit present.     Mental Status: She is alert.  Psychiatric:        Mood and Affect: Mood normal.       Assessment/Plan: 1. Acute non-recurrent maxillary sinusitis  - azithromycin (ZITHROMAX) 250 MG tablet; Take 2 tablets on day 1, then 1 tablet daily on days 2 through 5  Dispense: 6 tablet; Refill: 0 - fluticasone (FLONASE) 50 MCG/ACT nasal spray; Place 2 sprays into both nostrils daily.  Dispense: 16 g; Refill: 6     Patient has allergy to  PCN and will be traveling to beach this weekend. She has responded well to Azithromycin in the past but understands she may need a second course of abx if this does not clear sinuses.    General Counseling: Raenell verbalizes understanding of the findings of todays visit and agrees with plan of treatment. I have discussed any further diagnostic evaluation that may be needed or ordered today. We also reviewed her medications today. she has been encouraged to call the office with any questions or concerns that should arise related to todays visit.    Time spent:15 Minutes   Viviano Simas [facility] Nurse Practitioner

## 2023-01-01 ENCOUNTER — Encounter: Payer: Self-pay | Admitting: Obstetrics and Gynecology

## 2023-01-04 ENCOUNTER — Other Ambulatory Visit: Payer: Self-pay | Admitting: Obstetrics and Gynecology

## 2023-01-04 DIAGNOSIS — Z1231 Encounter for screening mammogram for malignant neoplasm of breast: Secondary | ICD-10-CM

## 2023-02-02 ENCOUNTER — Ambulatory Visit
Admission: RE | Admit: 2023-02-02 | Discharge: 2023-02-02 | Disposition: A | Discharge status: Home / Self Care | Payer: BC Managed Care – PPO | Source: Ambulatory Visit | Attending: Obstetrics and Gynecology | Admitting: Obstetrics and Gynecology

## 2023-02-02 DIAGNOSIS — Z1231 Encounter for screening mammogram for malignant neoplasm of breast: Secondary | ICD-10-CM | POA: Insufficient documentation

## 2023-06-26 IMAGING — MG MM DIGITAL SCREENING BILAT W/ TOMO AND CAD
8 series · 9 of 24 positions shown · non-contrast
Comparison: Previous exam(s).

CLINICAL DATA: Screening.

EXAM:
DIGITAL SCREENING BILATERAL MAMMOGRAM WITH TOMOSYNTHESIS AND CAD
TECHNIQUE: Bilateral screening digital craniocaudal and mediolateral oblique
mammograms were obtained. Bilateral screening digital breast
tomosynthesis was performed. The images were evaluated with
computer-aided detection.

[L CC synth-2D]
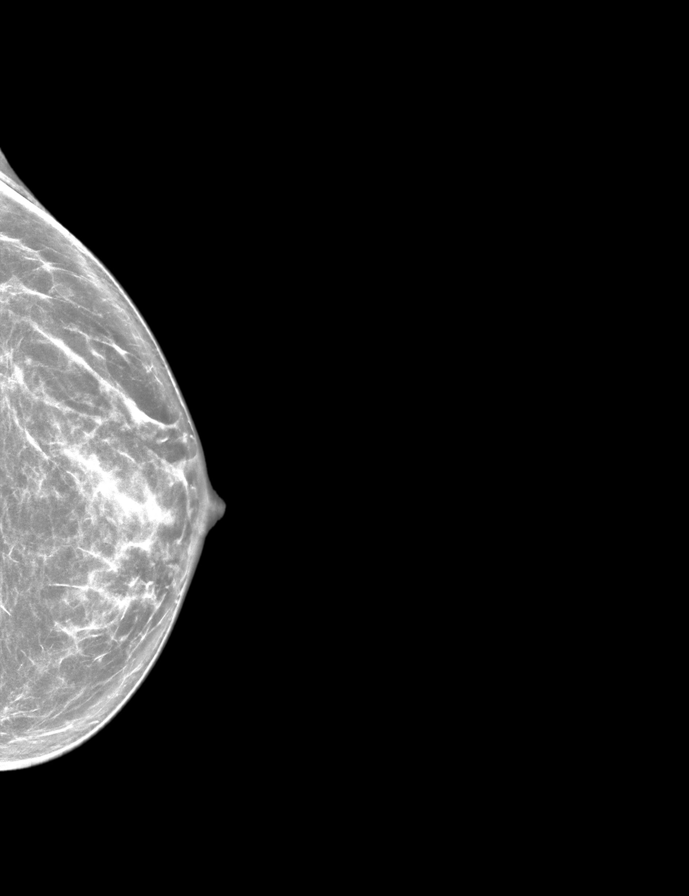

[R MLO synth-2D]
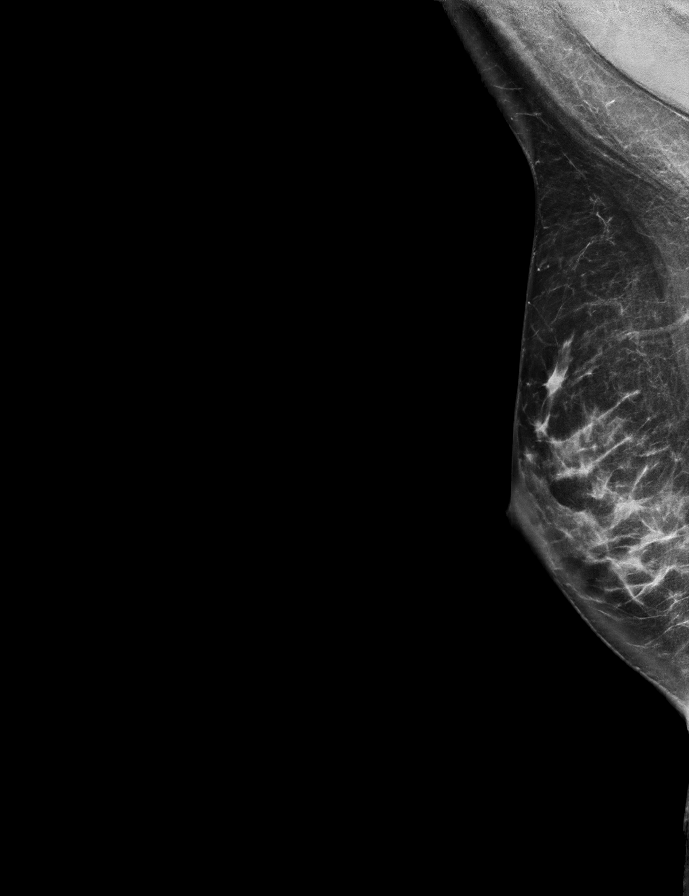

[L MLO synth-2D]
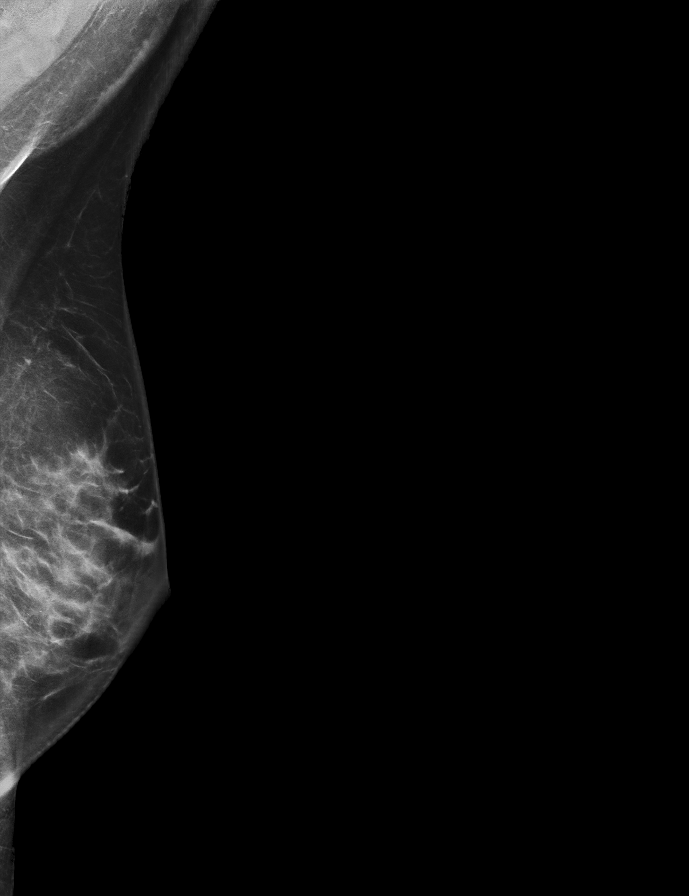

[R CC synth-2D]
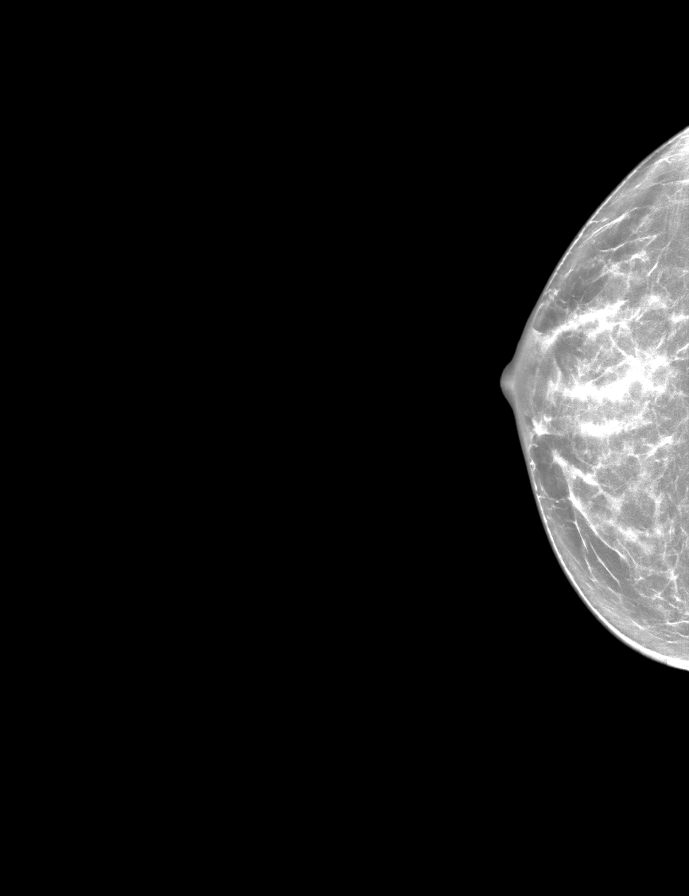

[R MLO tomo · 2 of 69 frames shown]
[frame 23/69]
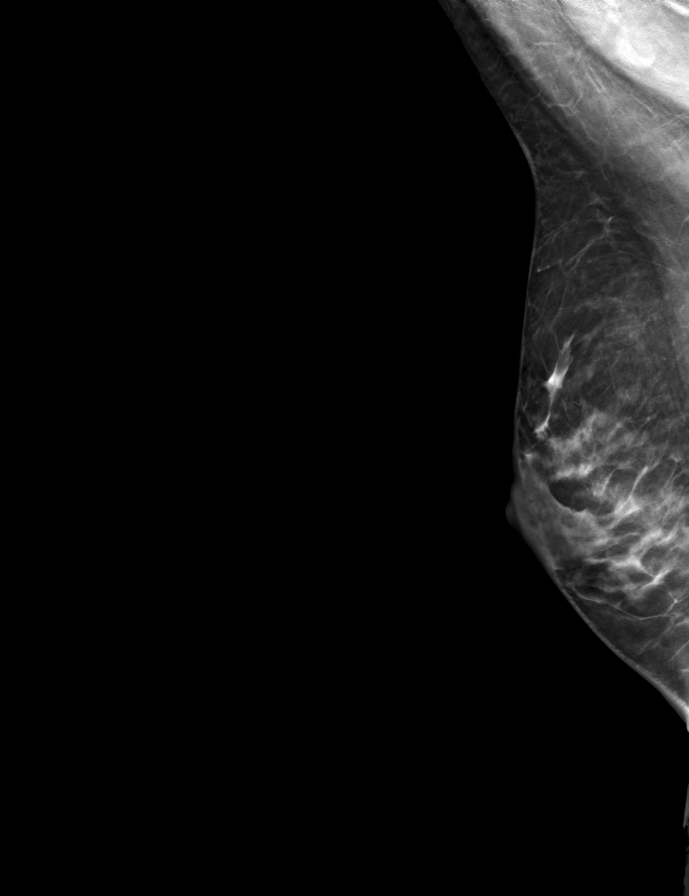
[frame 35/69]
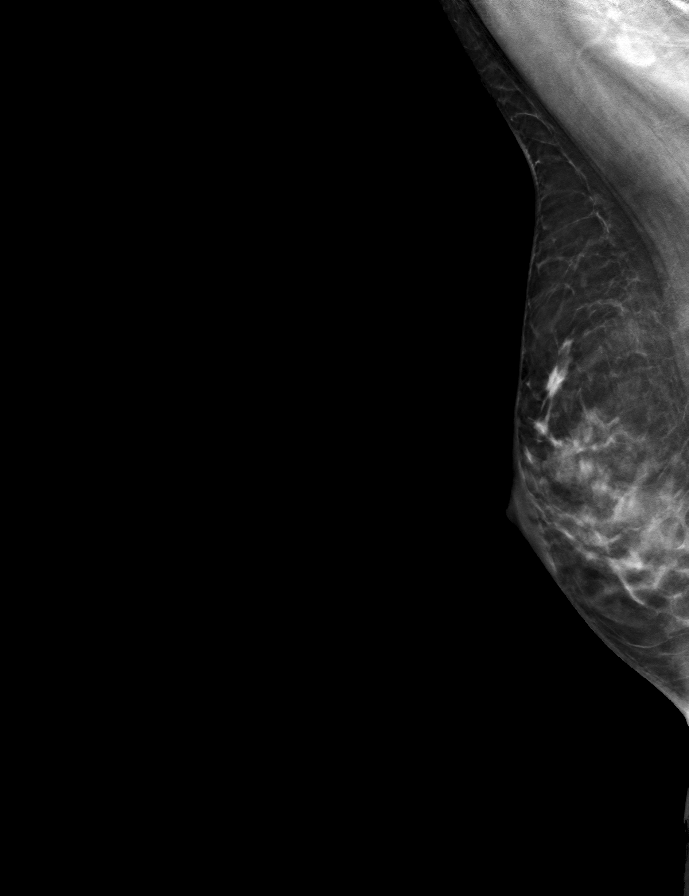

[L CC tomo · tomo slice 33/64.0]
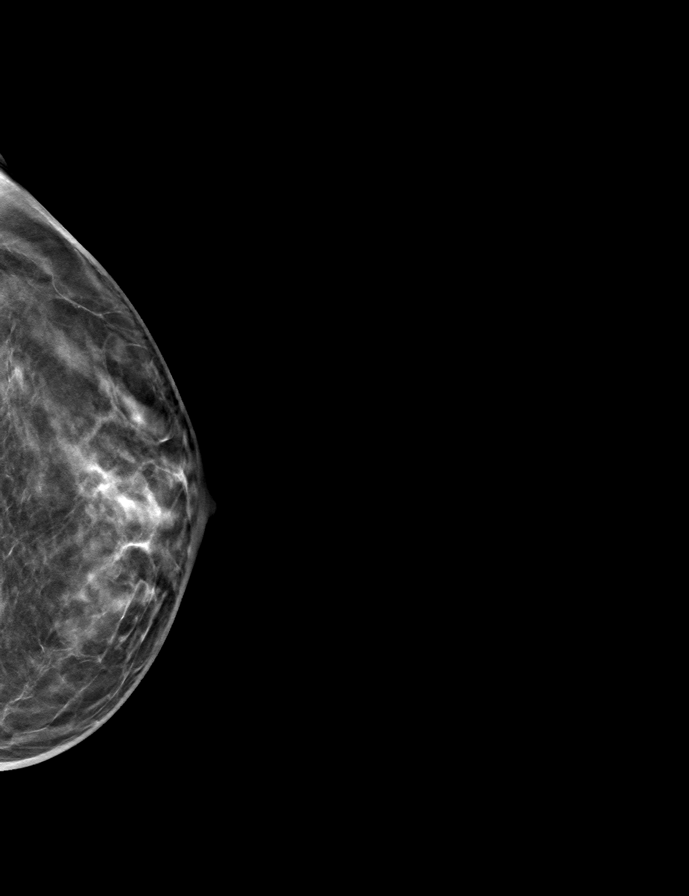

[R CC tomo · tomo slice 30/59.0]
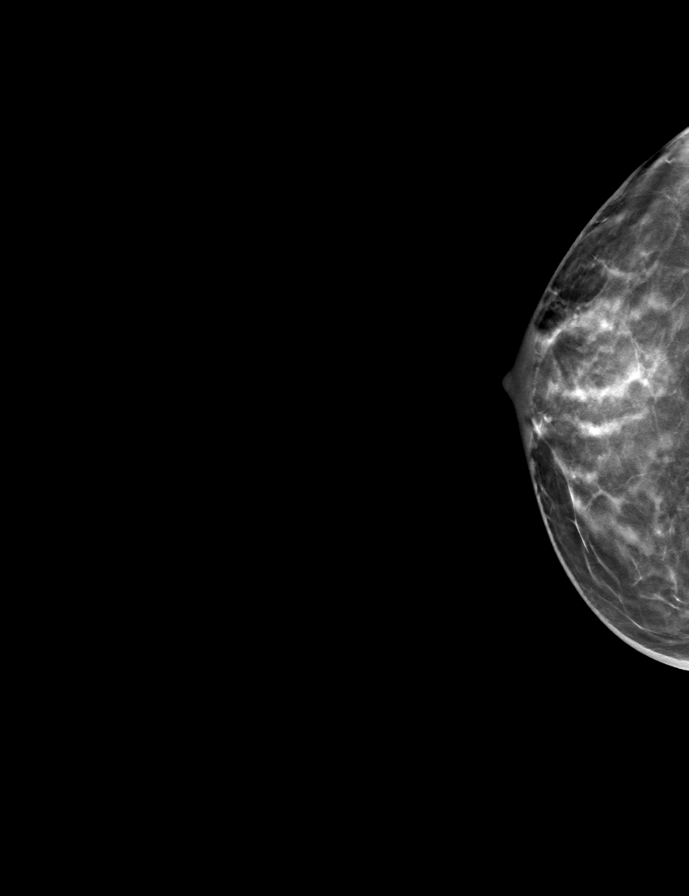

[L MLO tomo · tomo slice 37/72.0]
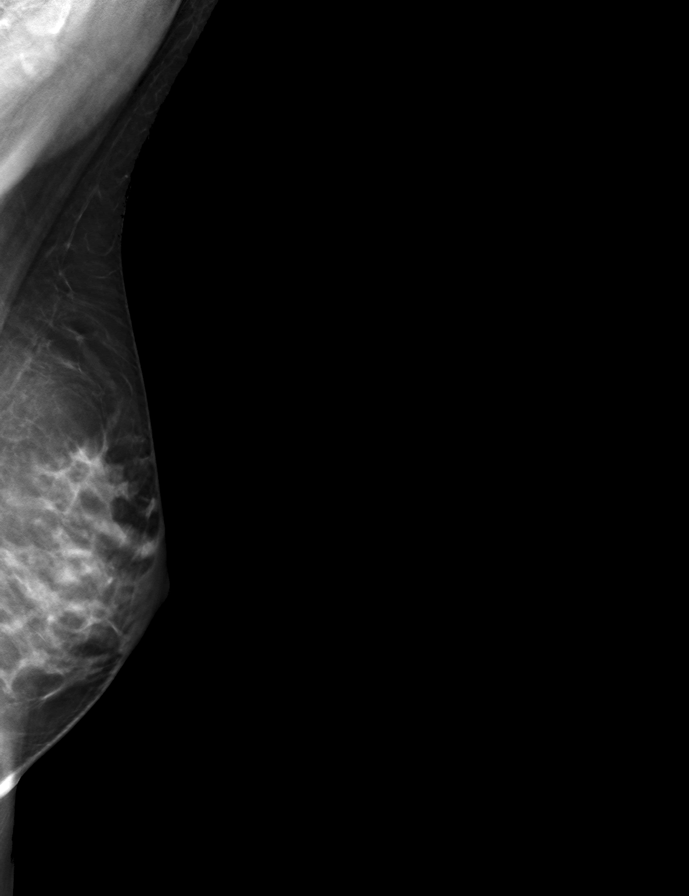

[9 of 24 positions shown; findings below may reference images not displayed]

ACR Breast Density Category c: The breast tissue is heterogeneously
dense, which may obscure small masses.
FINDINGS: There are no findings suspicious for malignancy.
IMPRESSION: No mammographic evidence of malignancy. A result letter of this
screening mammogram will be mailed directly to the patient.

RECOMMENDATION:
Screening mammogram in one year. (Code:Q3-W-BC3)

BI-RADS CATEGORY  1: Negative.

## 2023-09-21 ENCOUNTER — Other Ambulatory Visit: Payer: Self-pay | Admitting: Internal Medicine

## 2023-09-21 DIAGNOSIS — Z0000 Encounter for general adult medical examination without abnormal findings: Secondary | ICD-10-CM

## 2023-09-21 DIAGNOSIS — Z8249 Family history of ischemic heart disease and other diseases of the circulatory system: Secondary | ICD-10-CM

## 2023-09-21 DIAGNOSIS — E782 Mixed hyperlipidemia: Secondary | ICD-10-CM

## 2023-10-04 ENCOUNTER — Ambulatory Visit
Admission: RE | Admit: 2023-10-04 | Discharge: 2023-10-04 | Disposition: A | Discharge status: Home / Self Care | Payer: BC Managed Care – PPO | Source: Ambulatory Visit | Attending: Internal Medicine | Admitting: Internal Medicine

## 2023-10-04 DIAGNOSIS — Z8249 Family history of ischemic heart disease and other diseases of the circulatory system: Secondary | ICD-10-CM | POA: Insufficient documentation

## 2023-10-04 DIAGNOSIS — E782 Mixed hyperlipidemia: Secondary | ICD-10-CM | POA: Insufficient documentation

## 2023-10-04 DIAGNOSIS — Z0000 Encounter for general adult medical examination without abnormal findings: Secondary | ICD-10-CM | POA: Insufficient documentation

## 2023-10-15 ENCOUNTER — Other Ambulatory Visit: Payer: Self-pay | Admitting: Internal Medicine

## 2023-10-15 DIAGNOSIS — K7689 Other specified diseases of liver: Secondary | ICD-10-CM

## 2023-10-22 ENCOUNTER — Ambulatory Visit
Admission: RE | Admit: 2023-10-22 | Discharge: 2023-10-22 | Disposition: A | Discharge status: Home / Self Care | Payer: BC Managed Care – PPO | Source: Ambulatory Visit | Attending: Internal Medicine | Admitting: Internal Medicine

## 2023-10-22 DIAGNOSIS — K7689 Other specified diseases of liver: Secondary | ICD-10-CM | POA: Insufficient documentation

## 2024-01-07 ENCOUNTER — Other Ambulatory Visit: Payer: Self-pay | Admitting: Obstetrics and Gynecology

## 2024-01-07 DIAGNOSIS — Z1231 Encounter for screening mammogram for malignant neoplasm of breast: Secondary | ICD-10-CM

## 2024-02-01 LAB — EXTERNAL GENERIC LAB PROCEDURE: COLOGUARD: NEGATIVE

## 2024-02-01 LAB — COLOGUARD: COLOGUARD: NEGATIVE

## 2024-02-03 ENCOUNTER — Ambulatory Visit
Admission: RE | Admit: 2024-02-03 | Discharge: 2024-02-03 | Disposition: A | Discharge status: Home / Self Care | Payer: Self-pay | Source: Ambulatory Visit | Attending: Obstetrics and Gynecology | Admitting: Obstetrics and Gynecology

## 2024-02-03 DIAGNOSIS — Z1231 Encounter for screening mammogram for malignant neoplasm of breast: Secondary | ICD-10-CM | POA: Insufficient documentation

## 2024-02-16 ENCOUNTER — Other Ambulatory Visit: Payer: Self-pay

## 2024-02-16 ENCOUNTER — Encounter: Payer: Self-pay | Admitting: Oncology

## 2024-02-16 ENCOUNTER — Ambulatory Visit: Payer: Self-pay | Admitting: Oncology

## 2024-02-16 VITALS — BP 135/83 | HR 68 | Temp 98.3°F | Ht 66.0 in | Wt 130.0 lb

## 2024-02-16 DIAGNOSIS — J0140 Acute pansinusitis, unspecified: Secondary | ICD-10-CM

## 2024-02-16 MED ORDER — CEFDINIR 300 MG PO CAPS: 300.0000 mg | ORAL_CAPSULE | Freq: Two times a day (BID) | ORAL | 0 refills | Status: AC

## 2024-02-16 NOTE — Progress Notes (Signed)
 [facility]  [address] [place], [place] [address] Phone: [phone] Fax: [phone]   Office Visit Note  Patient Name: Tiffany Mayer  Date of ONGEX:[id number]  Med Rec number [id number]  Date of Service: 02/16/2024  Amoxicillin and Codeine  Chief Complaint  Patient presents with   Sick visit    Patient c/o nasal and chest congestion. Symptoms began 8 days ago. She has been taking Mucinex daily and Flonase as needed.    HPI Patient is an 53 y.o. female who complains of itchy throat, sneezing and [place] that started last week. Developed fever, HA and jaw pain over the weekend. R ear feels congested and clogged and feels like it needs to pop. Has been taking advil and mucinex.  Uses Flonase occasionally.  Does not routinely take an antihistamine.  Yellow/bloody nasal drainage.  Has a cough that has slightly improved.  Has severe facial pain.  Has had a sinus infection in the past and symptoms feel similar. Was sick in March and did not require abx.   Current Medication:  Outpatient Encounter Medications as of 02/16/2024  Medication Sig   cefdinir (OMNICEF) 300 MG capsule Take 1 capsule (300 mg total) by mouth 2 (two) times daily.   fluticasone (FLONASE) 50 MCG/ACT nasal spray Place 2 sprays into both nostrils daily. (Patient taking differently: Place 2 sprays into both nostrils daily as needed.)   guaiFENesin (MUCINEX) 600 MG 12 hr tablet Take by mouth 2 (two) times daily.   LO LOESTRIN FE 1 MG-10 MCG / 10 MCG tablet Take 1 tablet by mouth daily.   metroNIDAZOLE (METROGEL) 1 % gel Apply 1 Application topically daily.   [DISCONTINUED] doxycycline (VIBRA-TABS) 100 MG tablet Take 1 tablet (100 mg total) by mouth 2 (two) times daily. (Patient not taking: Reported on 07/22/2022)   [DISCONTINUED] fluticasone (FLONASE) 50 MCG/ACT nasal spray Place into the nose.   [DISCONTINUED] Levonorg-Eth Estrad Triphasic (TRIPHASIL, 28, PO) Take 1 tablet by mouth daily.     [DISCONTINUED]  Multiple Vitamin (MULTIVITAMIN) capsule Take 1 capsule by mouth daily.   No facility-administered encounter medications on file as of 02/16/2024.     Medical History: No past medical history on file.   Vital Signs: BP 135/83   Pulse 68   Temp 98.3 F (36.8 C)   Ht 5\' 6"  (1.676 m)   Wt 130 lb (59 kg)   LMP 01/13/2022   SpO2 95%   BMI 20.98 kg/m   ROS: As per HPI.  All other pertinent ROS negative.     Review of Systems  Constitutional:  Positive for chills, diaphoresis, fatigue and fever.  HENT:  Positive for congestion, ear pain, postnasal drip, sinus pressure, sinus pain and sneezing. Negative for sore throat.   Respiratory:  Positive for cough and shortness of breath.   Gastrointestinal:  Positive for nausea. Negative for diarrhea.  Musculoskeletal:  Positive for myalgias.  Neurological:  Positive for headaches. Negative for dizziness.  Hematological:  Negative for adenopathy.    Physical Exam Vitals reviewed.  Constitutional:      Appearance: Normal appearance.  HENT:     Right Ear: A middle ear effusion is present.     Left Ear: A middle ear effusion is present.     Nose: Mucosal edema, congestion and rhinorrhea present. Rhinorrhea is bloody.     Right Turbinates: Pale.     Left Turbinates: Pale.     Right Sinus: Maxillary sinus tenderness and frontal sinus tenderness present.  Left Sinus: Maxillary sinus tenderness and frontal sinus tenderness present.     Mouth/Throat:     Mouth: Mucous membranes are moist.     Pharynx: Postnasal drip present. No posterior oropharyngeal erythema.     Tonsils: 0 on the right. 0 on the left.  Cardiovascular:     Rate and Rhythm: Normal rate and regular rhythm.  Pulmonary:     Effort: Pulmonary effort is normal.     Breath sounds: Normal breath sounds.  Lymphadenopathy:     Head:     Right side of head: No submandibular or tonsillar adenopathy.     Left side of head: No submandibular or tonsillar adenopathy.     Cervical:  No cervical adenopathy.  Neurological:     Mental Status: She is alert.    No results found for this or any previous visit (from the past 24 hours).  Assessment/Plan: 1. Acute non-recurrent pansinusitis (Primary) Exam is concerning for pansinusitis.  We discussed cefdinir twice daily x 10 days given she has a penicillin allergy.  Continue OTC Mucinex, Flonase and antihistamine while symptomatic and then you may stop.  Finish antibiotic completely.  Please let me know if your symptoms do not improve over the next 3 to 5 days.   - cefdinir (OMNICEF) 300 MG capsule; Take 1 capsule (300 mg total) by mouth 2 (two) times daily.  Dispense: 20 capsule; Refill: 0   General Counseling: Tiffany Mayer verbalizes understanding of the findings of todays visit and agrees with plan of treatment. I have discussed any further diagnostic evaluation that may be needed or ordered today. We also reviewed her medications today. she has been encouraged to call the office with any questions or concerns that should arise related to todays visit.   No orders of the defined types were placed in this encounter.   Meds ordered this encounter  Medications   cefdinir (OMNICEF) 300 MG capsule    Sig: Take 1 capsule (300 mg total) by mouth 2 (two) times daily.    Dispense:  20 capsule    Refill:  0    I spent 25 minutes dedicated to the care of this patient (face-to-face and non-face-to-face) on the date of the encounter to include what is described in the assessment and plan.   Durenda Hurt, NP 02/16/2024 2:40 PM

## 2024-11-08 ENCOUNTER — Ambulatory Visit

## 2024-11-08 VITALS — BP 110/80 | Ht 66.0 in | Wt 135.0 lb

## 2024-11-08 DIAGNOSIS — S46811A Strain of other muscles, fascia and tendons at shoulder and upper arm level, right arm, initial encounter: Secondary | ICD-10-CM

## 2024-11-08 MED ORDER — CYCLOBENZAPRINE HCL 10 MG PO TABS: 10.0000 mg | ORAL_TABLET | Freq: Every evening | ORAL | 0 refills | Status: DC | PRN

## 2024-11-08 NOTE — Progress Notes (Signed)
" °  PCP: Cleotilde Oneil FALCON, MD  Subjective:   HPI: Patient is a 53 y.o. female here for right upper back and neck pain.  Patient states that the pain started in August with no known injury.  She denies any previous injury to her neck.  She points to her right trapezius region to where her pain is located.  She states that the pain originates in that area and radiates up towards her right neck.  She has pain with rotation of her head.  She will take some Advil which completely resolves the pain.  She states that sometimes when she runs she gets a tingling to her towards her right medial arm but this is not persistent and is rarely been accompanied by this pain.    No past medical history on file.  Medications Ordered Prior to Encounter[1]  BP 110/80   Ht 5' 6 (1.676 m)   Wt 135 lb (61.2 kg)   LMP 01/13/2022   BMI 21.79 kg/m        Objective:   Physical Exam:  Gen: NAD, comfortable in exam room Cervical spine/right shoulder Inspection: No erythema, edema or warmth Palpation: Tender to palpate over the body of the right trapezius radiating up towards the soft tissue of the paracervical region on the right ROM: Patient has full flexion and extension, limited with rotation due to pain at the right trapezius, full shoulder flexion and abduction Special Tests: Negative Spurling's bilaterally, rotator cuff testing is negative Neuro: Bilateral upper extremity sensation is equal and intact, interosseous testing, grip strength, elbow flexion, elbow extension is 5/5 and equal  Assessment/Plan:   Tiffany Mayer is a 53 y.o. female who was seen today for the following: 1. Strain of right trapezius muscle, initial encounter (Primary) - cyclobenzaprine (FLEXERIL) 10 MG tablet; Take 1 tablet (10 mg total) by mouth at bedtime as needed for muscle spasms.  Dispense: 30 tablet; Refill: 0 - Patient is exam is reassuring that she is likely dealing from a trapezial strain - Discussed conservative  options including muscle relaxers, diclofenac gel and stretching - Patient was agreeable to this conservative plan - Discussed not to take muscle relaxers when driving - Invited her to return in 4 weeks or if there are changing symptoms - If patient is having persistent issues, we may pursue x-ray of the cervical spine - Follow-up in 4 weeks if needed  Follow-up/Education:   No follow-ups on file.   May return sooner as needed and encouraged to call/e-mail for additional questions or  worsening symptoms in the interim.  Krystal Lowing, DO Sports Medicine Fellow 11/08/2024 1:16 PM     [1]  Current Outpatient Medications on File Prior to Visit  Medication Sig Dispense Refill   cefdinir  (OMNICEF ) 300 MG capsule Take 1 capsule (300 mg total) by mouth 2 (two) times daily. 20 capsule 0   fluticasone  (FLONASE ) 50 MCG/ACT nasal spray Place 2 sprays into both nostrils daily. (Patient taking differently: Place 2 sprays into both nostrils daily as needed.) 16 g 6   guaiFENesin (MUCINEX) 600 MG 12 hr tablet Take by mouth 2 (two) times daily.     LO LOESTRIN FE 1 MG-10 MCG / 10 MCG tablet Take 1 tablet by mouth daily.     metroNIDAZOLE (METROGEL) 1 % gel Apply 1 Application topically daily.     No current facility-administered medications on file prior to visit.   "

## 2024-11-08 NOTE — Patient Instructions (Signed)
 You likely have a right trapezial muscle strain  Try the muscle relaxer at night as needed  Use topical voltaren gel or diclofenac gel up to 4x daily  Stretching routine  We will have a follow up in 4 weeks unless you are improved then you can cancel.

## 2024-12-06 ENCOUNTER — Ambulatory Visit (INDEPENDENT_AMBULATORY_CARE_PROVIDER_SITE_OTHER)

## 2024-12-06 VITALS — BP 104/76 | Ht 66.0 in | Wt 135.0 lb

## 2024-12-06 DIAGNOSIS — S46811A Strain of other muscles, fascia and tendons at shoulder and upper arm level, right arm, initial encounter: Secondary | ICD-10-CM | POA: Diagnosis not present

## 2024-12-06 MED ORDER — METHOCARBAMOL 500 MG PO TABS: 500.0000 mg | ORAL_TABLET | Freq: Every evening | ORAL | 0 refills | Status: AC | PRN

## 2024-12-06 NOTE — Progress Notes (Signed)
 " PCP: Cleotilde Oneil FALCON, MD  Subjective:   HPI: Patient is a 54 y.o. female here for follow-up right trapezial strain.  Patient was seen on 11/08/2024 and diagnosed with a strain of her trapezius muscle, given muscle relaxers as well as exercises and recommendation to continue topical diclofenac and Aleve as needed.  Patient states that overall she is improving.  The muscle relaxer made her very groggy.  She tried to take 1/2 tablet but it still made her very groggy.  The Aleve continues to be very helpful.  Voltaren gel does help some but not as much as daily.  She has been in the exercises which have improved her symptoms.  She still states that turning her head she still has a pain over her right trapezius, as well as when she does some daily activities and moving her arm as well as sleeping.  She denies any worsening or new symptoms.  No past medical history on file.  Medications Ordered Prior to Encounter[1]  BP 104/76   Ht 5' 6 (1.676 m)   Wt 135 lb (61.2 kg)   LMP 01/13/2022   BMI 21.79 kg/m        Objective:   Physical Exam:  Gen: NAD, comfortable in exam room Right shoulder/trapezius Inspection: No erythema, edema or warmth Palpation: TTP over the body of the right trapezius rating up towards the right paracervical region ROM: Continued full flexion and extension, limited with left rotation due to pain at the right trapezius Neuro: Neurovascular intact distally  Assessment/Plan:   Tiffany Mayer is a 54 y.o. female who was seen today for the following: 1. Strain of right trapezius muscle, initial encounter (Primary) - methocarbamol  (ROBAXIN ) 500 MG tablet; Take 1 tablet (500 mg total) by mouth at bedtime as needed for up to 21 days for muscle spasms.  Dispense: 21 tablet; Refill: 0 - Patient is improving with her right trapezial strain/trigger point - Discussed continue conservative management versus attempted trigger point injection - Patient was agreeable to  trigger point injection today - She tolerated the procedure well - Continue stretching, topical gel, Advil - Stop Flexeril  - Will send in methocarbamol  - Will follow-up in 4 weeks unless patient is improved and at that time she can cancel  Trigger Point Injection of the right trapezius muscle Consent obtained and verified. Time-out conducted. Noted no overlying erythema, induration, or other signs of local infection. Skin was prepped in a sterile fashion. Topical analgesic spray: None Completed without difficulty and patient tolerated well.  I injected 2 trigger points along the right trapezius muscle.  Injections were superficial in the body of the trapezius, not near the cervical region. Meds: 1% lidocaine a total of 0.5 cc was injected into each trigger point Advised to call if fevers/chills, erythema, induration, drainage, or persistent bleeding.  Follow-up/Education:   Return in about 4 weeks (around 01/03/2025).   May return sooner as needed and encouraged to call/e-mail for additional questions or  worsening symptoms in the interim.  Krystal Lowing, DO Sports Medicine Fellow 12/06/2024 10:35 AM     [1]  Current Outpatient Medications on File Prior to Visit  Medication Sig Dispense Refill   cefdinir  (OMNICEF ) 300 MG capsule Take 1 capsule (300 mg total) by mouth 2 (two) times daily. 20 capsule 0   fluticasone  (FLONASE ) 50 MCG/ACT nasal spray Place 2 sprays into both nostrils daily. (Patient taking differently: Place 2 sprays into both nostrils daily as needed.) 16 g 6   guaiFENesin (  MUCINEX) 600 MG 12 hr tablet Take by mouth 2 (two) times daily.     LO LOESTRIN FE 1 MG-10 MCG / 10 MCG tablet Take 1 tablet by mouth daily.     metroNIDAZOLE (METROGEL) 1 % gel Apply 1 Application topically daily.     No current facility-administered medications on file prior to visit.   "

## 2024-12-06 NOTE — Patient Instructions (Signed)
-   Changing her muscle relaxer to a formula that is less sedating - Use as needed at night - Continue Advil and topical gel - Continue stretching - Follow-up in 4 weeks

## 2025-01-03 ENCOUNTER — Ambulatory Visit
